# Patient Record
Sex: Female | Born: 1960 | Race: White | Hispanic: No | Marital: Married | Smoking: Current every day smoker
Health system: Southern US, Community
[De-identification: ages and names within clinical notes are randomized; demographics above are authoritative.]

## PROBLEM LIST (undated history)

## (undated) DIAGNOSIS — J45909 Unspecified asthma, uncomplicated: Secondary | ICD-10-CM

---

## 2004-10-06 ENCOUNTER — Ambulatory Visit: Payer: Self-pay | Admitting: Internal Medicine

## 2005-05-07 ENCOUNTER — Ambulatory Visit: Payer: Self-pay

## 2006-02-08 ENCOUNTER — Emergency Department: Payer: Self-pay | Admitting: Emergency Medicine

## 2006-02-09 ENCOUNTER — Ambulatory Visit: Payer: Self-pay | Admitting: Emergency Medicine

## 2011-11-24 ENCOUNTER — Emergency Department: Payer: Self-pay | Admitting: Emergency Medicine

## 2011-11-24 LAB — CBC
HGB: 14.4 g/dL (ref 12.0–16.0)
Platelet: 256 10*3/uL (ref 150–440)
RDW: 13.5 % (ref 11.5–14.5)
WBC: 13.7 10*3/uL — ABNORMAL HIGH (ref 3.6–11.0)

## 2011-11-24 LAB — COMPREHENSIVE METABOLIC PANEL
Anion Gap: 10 (ref 7–16)
BUN: 15 mg/dL (ref 7–18)
Bilirubin,Total: 0.2 mg/dL (ref 0.2–1.0)
Chloride: 102 mmol/L (ref 98–107)
Co2: 31 mmol/L (ref 21–32)
Creatinine: 0.9 mg/dL (ref 0.60–1.30)
EGFR (African American): >60
EGFR (Non-African Amer.): >60
Potassium: 4.3 mmol/L (ref 3.5–5.1)
Sodium: 143 mmol/L (ref 136–145)

## 2011-11-24 LAB — CK TOTAL AND CKMB (NOT AT ARMC)
CK, Total: 95 U/L (ref 21–215)
CK-MB: 2.5 ng/mL (ref 0.5–3.6)

## 2012-02-01 ENCOUNTER — Emergency Department: Payer: Self-pay | Admitting: Emergency Medicine

## 2012-02-10 ENCOUNTER — Ambulatory Visit: Payer: Self-pay | Admitting: Family Medicine

## 2012-05-04 ENCOUNTER — Ambulatory Visit: Payer: Self-pay | Admitting: Family Medicine

## 2012-08-23 ENCOUNTER — Ambulatory Visit: Payer: Self-pay | Admitting: Family Medicine

## 2013-02-28 ENCOUNTER — Ambulatory Visit: Payer: Self-pay | Admitting: Family Medicine

## 2013-03-23 ENCOUNTER — Ambulatory Visit: Payer: Self-pay | Admitting: Cardiothoracic Surgery

## 2013-04-21 ENCOUNTER — Ambulatory Visit: Payer: Self-pay | Admitting: Cardiothoracic Surgery

## 2013-05-02 ENCOUNTER — Ambulatory Visit: Payer: Self-pay | Admitting: Specialist

## 2013-05-10 ENCOUNTER — Ambulatory Visit: Payer: Self-pay | Admitting: Family Medicine

## 2013-08-28 ENCOUNTER — Ambulatory Visit: Payer: Self-pay | Admitting: Specialist

## 2013-08-29 ENCOUNTER — Ambulatory Visit: Payer: Self-pay | Admitting: Radiation Oncology

## 2013-09-21 ENCOUNTER — Ambulatory Visit: Payer: Self-pay | Admitting: Radiation Oncology

## 2014-01-22 ENCOUNTER — Ambulatory Visit: Payer: Self-pay | Admitting: Nurse Practitioner

## 2014-07-27 ENCOUNTER — Ambulatory Visit: Payer: Self-pay | Admitting: Neurology

## 2014-08-14 ENCOUNTER — Ambulatory Visit: Payer: Self-pay | Admitting: Nurse Practitioner

## 2014-09-11 ENCOUNTER — Ambulatory Visit: Payer: Self-pay | Admitting: Specialist

## 2015-01-11 NOTE — Consult Note (Signed)
Reason for Visit: This 54 year old Female patient presents to the clinic for initial evaluation of  left upper lobe lung mass .   Referred by Dr. Inez Catalina.  Diagnosis:  Chief Complaint/Diagnosis   54 year old female with unbiopsied 7 mm left upper lobe nodule.  Imaging Report serial CT scans reviewed   Referral Report clinical notes reviewed   Planned Treatment Regimen observation   HPI   patient is a 54 year old female whose history dates back approximately a year when she presented with flulike symptoms and possible rib fracture CT scan of the chest revealed a left upper lobe mass approximately 6 mm in greatest dimension. This was followed with a CT scan about 6 months later showing a possible 1 mm growth in the lesion. She has significant COPD with aFEV1 of 37 is percent of predicted. Seen by surgical oncology and not an operative candidate based on her pulmonary functions. She has long-standing history of smoking abuse. She specifically denies cough hemoptysis or chest tightness.  Past, Family and Social History:  Past Medical History positive   Respiratory COPD   Genitourinary kidney stones   Past Surgical History cholecystectomy   Family History positive   Family History Comments husband with lung cancer   Social History positive   Social History Comments greater than 40-40 year pack smoking history no EtOH abuse history   Additional Past Medical and Surgical History accompanied by multiple family members today   Allergies:   No Known Allergies:   Home Meds:  Home Medications: Medication Instructions Status  Ventolin HFA 90 mcg/inh inhalation aerosol 2 puff(s) inhaled 4 times a day Active  Breo Ellipta 100 mcg-25 mcg inhalation powder 1 puff(s) inhaled once a day Active  Vitamin D3 5000 intl units oral tablet 1 tab(s) orally once a day Active  Spiriva 18 mcg inhalation capsule 1 each inhaled once a day Active   Review of Systems:  General negative    Performance Status (ECOG) 0   Skin negative   Breast negative   Ophthalmologic negative   ENMT negative   Respiratory and Thorax see HPI   Cardiovascular negative   Gastrointestinal negative   Genitourinary negative   Musculoskeletal negative   Neurological negative   Psychiatric negative   Hematology/Lymphatics negative   Endocrine negative   Allergic/Immunologic negative   Review of Systems   according to the nurse's notesPatient denies any weight loss, fatigue, weakness, fever, chills or night sweats. Patient denies any loss of vision, blurred vision. Patient denies any ringing  of the ears or hearing loss. No irregular heartbeat. Patient denies heart murmur or history of fainting. Patient denies any chest pain or pain radiating to her upper extremities. Patient denies any shortness of breath, difficulty breathing at night, cough or hemoptysis. Patient denies any swelling in the lower legs. Patient denies any nausea vomiting, vomiting of blood, or coffee ground material in the vomitus. Patient denies any stomach pain. Patient states has had normal bowel movements no significant constipation or diarrhea. Patient denies any dysuria, hematuria or significant nocturia. Patient denies any problems walking, swelling in the joints or loss of balance. Patient denies any skin changes, loss of hair or loss of weight. Patient denies any excessive worrying or anxiety or significant depression. Patient denies any problems with insomnia. Patient denies excessive thirst, polyuria, polydipsia. Patient denies any swollen glands, patient denies easy bruising or easy bleeding. Patient denies any recent infections, allergies or URI. Patient "s visual fields have not changed significantly in recent  time.  Nursing Notes:  Nursing Vital Signs and Chemo Nursing Nursing Notes: *CC Vital Signs Flowsheet:   07-Jul-14 13:13  Temp Temperature 97.1  Pulse Pulse 98  Respirations Respirations 22  SBP SBP  150  DBP DBP 87  Pain Scale (0-10)  0  Current Weight (kg) (kg) 99.8  Height (cm) centimeters 157.4  BSA (m2) 1.9   Physical Exam:  General/Skin/HEENT:  Skin normal   Eyes normal   ENMT normal   Head and Neck normal   Additional PE well-developed obese female in NAD. Lungs are clear to A&P cardiac examination shows regular rate and rhythm. No cervical supraclavicular or axillary adenopathy is appreciated abdomen is obese with no organomegaly or masses noted.   Breasts/Resp/CV/GI/GU:  Respiratory and Thorax normal   Cardiovascular normal   Gastrointestinal normal   Genitourinary normal   MS/Neuro/Psych/Lymph:  Musculoskeletal normal   Neurological normal   Lymphatics normal   Other Results:  Radiology Results: LabUnknown:    10-Jun-14 11:08, CT Chest Without Contrast  PACS Image   CT:    05-Mar-13 17:32, CT Chest for Pulm Embolism With Contrast  CT Chest for Pulm Embolism With Contrast   REASON FOR EXAM:    chest pain, tachycardia, hypoxia, + d-dimer  COMMENTS:       PROCEDURE: CT  - CT CHEST (FOR PE) W  - Nov 24 2011  5:32PM     RESULT: Chest CT dated 11/24/2011.    Technique: Helical 3 mm sections were obtained the thoracic inlet the  lung bases status post intravenous administration of 75 mL of Isovue-370.    Findings: Evaluation mediastinum and hilar regions and structures   demonstrates no evidence of mediastinal or hilar adenopathy nor masses.   There is no evidence of filling defects within the main, lobar, or   segmental pulmonary arteries. A very small wedge-shaped area of increased   density projects along the anterior cardio phrenic angle region may     reflect atelectasis or possibly scarring. The lung parenchyma otherwise   demonstrates an ill-defined small area of nodular density along the   posterior periphery of the lateral segment of the right middle lobe.   Measuring approximately 9 mm in longitudinal dimensions. Similar   different  considerations. Due tonodular component surveillance of this   finding is recommended.    Visualized upper abdominal viscera demonstrate fullness in the region of   the uncinate process of the pancreas. This may simple represent volume   averaging from the from surrounding soft tissues pole mass cannot be   excluded. Followup dedicated CT of the abdomen it is recommended.    IMPRESSION:   1. No CT evidence of pulmonary arterial embolic disease.  2. Areas of increased density within the right middle lobe as well as the    anterior cardiophrenic angle region. Surveillance evaluation is   recommended.  3. Finding which may reps in a mass within the uncinate process of the   pancreas followup dedicated CT of the abdomen is recommended.          Verified By: Jani Files, M.D., MD    10-Jun-14 11:08, CT Chest Without Contrast  CT Chest Without Contrast   REASON FOR EXAM:    Follow up lung nodule  COMMENTS:       PROCEDURE: CT  - CT CHEST WITHOUT CONTRAST  - Feb 28 2013 11:08AM     RESULT: History: Pulmonary nodule.    Comparison Study: Prior CT of 08/23/2012.  Findings: Standard nonenhanced CT obtained. Lung CAD obtained. Previously   identified pulmonary nodule in the left upper lobe (lung windows image   #23 )measured 5x 3 mm old prior study of 08/23/2012. On today's   examination it appears to measure approximately 6 x 3 mm and appears   slightly more dense ( lung windows image #22). Tiny lung cancer cannot be   excluded. Slight interstitial process nodularity noted elsewhere     including the lingula is unchanged most likely related to interstitial   scarring. Shotty mediastinal lymph nodes are noted. Thoracic were aorta   caliber normal. Adrenals normal. Right nephrolithiasis. Cholecystectomy.   No acute or focal bony abnormality.    IMPRESSION:  Cannot exclude very subtle interval increase in left upper   lobe pulmonary nodule size.Tiny lung carcinoma cannot be  excluded.   Consultation with oncology suggested.        Verified By: Gwynn BurlyHOMAS E. REGISTER, M.D., MD   Relevent Results:   Relevant Scans and Labs serial CT scans are reviewed.   Assessment and Plan: Impression:   questionable left upper lobe small extremely slow growing mass questionable for malignancy Plan:   at this time I believe this lesion is so small and its growth pattern over 6 months or so incrementally small I believe we can continue to observe this at this time. Esophagus which showed a PET/CT scan. I have asked to see her back in 4 months and we'll repeat CT scan at that time. Should there be significant growth of the lesion over time would recommend her for biopsy either with GPS navigation or CT-guided fine-needle. All this was explained to the patient and her family and they're comfortable with my decision to continue to observe. I discussed the case personally with Dr. Doylene Canninghoksi who agrees with the treatment plan.  I would like to take this opportunity to thank you for allowing me to continue to participate in this patient's care.  CC Referral:  cc: Dr. Beverely RisenFozia Khan   Electronic Signatures: Rushie Chestnuthrystal, Gordy CouncilmanGlenn S (MD)  (Signed 07-Jul-14 15:20)  Authored: HPI, Diagnosis, PFSH, Allergies, Home Meds, ROS, Nursing Notes, Physical Exam, Other Results, Relevent Results, Encounter Assessment and Plan, CC Referring Physician   Last Updated: 07-Jul-14 15:20 by Rebeca Alerthrystal, Roey Coopman S (MD)

## 2015-02-02 ENCOUNTER — Emergency Department
Admission: EM | Admit: 2015-02-02 | Discharge: 2015-02-03 | Disposition: A | Discharge status: Home / Self Care | Payer: BLUE CROSS/BLUE SHIELD | Attending: Student | Admitting: Student

## 2015-02-02 ENCOUNTER — Emergency Department: Payer: BLUE CROSS/BLUE SHIELD

## 2015-02-02 ENCOUNTER — Other Ambulatory Visit: Payer: Self-pay

## 2015-02-02 DIAGNOSIS — J441 Chronic obstructive pulmonary disease with (acute) exacerbation: Secondary | ICD-10-CM | POA: Diagnosis not present

## 2015-02-02 DIAGNOSIS — R531 Weakness: Secondary | ICD-10-CM | POA: Diagnosis present

## 2015-02-02 DIAGNOSIS — M79622 Pain in left upper arm: Secondary | ICD-10-CM | POA: Insufficient documentation

## 2015-02-02 DIAGNOSIS — R52 Pain, unspecified: Secondary | ICD-10-CM

## 2015-02-02 HISTORY — DX: Unspecified asthma, uncomplicated: J45.909

## 2015-02-02 LAB — COMPREHENSIVE METABOLIC PANEL
ALBUMIN: 4.2 g/dL (ref 3.5–5.0)
ALT: 38 U/L (ref 14–54)
AST: 32 U/L (ref 15–41)
Alkaline Phosphatase: 83 U/L (ref 38–126)
Anion gap: 7 (ref 5–15)
BUN: 14 mg/dL (ref 6–20)
CO2: 33 mmol/L — AB (ref 22–32)
Calcium: 9.1 mg/dL (ref 8.9–10.3)
Chloride: 101 mmol/L (ref 101–111)
Creatinine, Ser: 0.64 mg/dL (ref 0.44–1.00)
GFR calc Af Amer: >60 mL/min (ref >60)
Glucose, Bld: 94 mg/dL (ref 65–99)
POTASSIUM: 4.8 mmol/L (ref 3.5–5.1)
Sodium: 141 mmol/L (ref 135–145)
TOTAL PROTEIN: 8.6 g/dL — AB (ref 6.5–8.1)
Total Bilirubin: 0.4 mg/dL (ref 0.3–1.2)

## 2015-02-02 LAB — URINALYSIS COMPLETE WITH MICROSCOPIC (ARMC ONLY)
BILIRUBIN URINE: NEGATIVE
Glucose, UA: NEGATIVE mg/dL
Hgb urine dipstick: NEGATIVE
Ketones, ur: NEGATIVE mg/dL
LEUKOCYTES UA: NEGATIVE
Nitrite: NEGATIVE
Protein, ur: NEGATIVE mg/dL
Specific Gravity, Urine: 1.015 (ref 1.005–1.030)
pH: 8 (ref 5.0–8.0)

## 2015-02-02 LAB — CBC WITH DIFFERENTIAL/PLATELET
BASOS PCT: 1 %
Basophils Absolute: 0.1 10*3/uL (ref 0–0.1)
EOS ABS: 0.3 10*3/uL (ref 0–0.7)
Eosinophils Relative: 3 %
HCT: 42.7 % (ref 35.0–47.0)
HEMOGLOBIN: 14.1 g/dL (ref 12.0–16.0)
LYMPHS ABS: 3.1 10*3/uL (ref 1.0–3.6)
Lymphocytes Relative: 30 %
MCH: 30 pg (ref 26.0–34.0)
MCHC: 33.1 g/dL (ref 32.0–36.0)
MCV: 90.6 fL (ref 80.0–100.0)
MONO ABS: 0.7 10*3/uL (ref 0.2–0.9)
Monocytes Relative: 7 %
NEUTROS PCT: 59 %
Neutro Abs: 6.2 10*3/uL (ref 1.4–6.5)
PLATELETS: 221 10*3/uL (ref 150–440)
RBC: 4.72 MIL/uL (ref 3.80–5.20)
RDW: 13.3 % (ref 11.5–14.5)
WBC: 10.4 10*3/uL (ref 3.6–11.0)

## 2015-02-02 LAB — TROPONIN I: Troponin I: <0.03 ng/mL (ref <0.031)

## 2015-02-02 MED ORDER — OXYCODONE-ACETAMINOPHEN 5-325 MG PO TABS
2.0000 | ORAL_TABLET | Freq: Once | ORAL | Status: AC
  Administered 2015-02-03: 2 via ORAL

## 2015-02-02 NOTE — ED Notes (Signed)
Pt presents to ER alert and in NAD. Pt reports she has stage 4 COPD. pt states she is supposed to be on O2, but doesn't wear it. Pt reports she is feeling weak today.

## 2015-02-02 NOTE — ED Notes (Signed)
Patient oxygen dependency prescribed 2L but recently has been increasing to 3L.  Reports difficulty walking even with walker due to generalized pains.  Reports having "bone pain" Left arm pain is the worst pain, but no swelling noted.  States with oxygen at 3L her headaches haven't been bad either.

## 2015-02-02 NOTE — ED Provider Notes (Signed)
[facility] Emergency Department Provider Note  ____________________________________________  Time seen: Approximately 11:09 PM  I have reviewed the triage vital signs and the nursing notes.   HISTORY  Chief Complaint Weakness    HPI Sierra Galloway is a 54 y.o. female stage IV COPD on chronic home oxygen requirement of 2 L, ovarian cyst versus cancer, obesity presents for evaluation one month of worsening dyspnea. She reports her dyspnea is also worse when she is exposed to cooking fumes at home. She has been using her albuterol nebulizer treatments which temporarily improve her symptoms. No chest pain. She has had cough but no fever. She has felt generally weak, worse for the past month. She is also complaining of constant left upper arm pain which has been atraumatic. Current severity of her arm pain is 4 out of 10, described as aching, movement makes it worse, no associated swelling.   Past Medical History  Diagnosis Date  . Asthma     There are no active problems to display for this patient.   No past surgical history on file.  No current outpatient prescriptions on file.  Allergies Review of patient's allergies indicates no known allergies.  No family history on file.  Social History History  Substance Use Topics  . Smoking status: Not on file  . Smokeless tobacco: Not on file  . Alcohol Use: Not on file    Review of Systems Constitutional: No fever/chills Eyes: No visual changes. ENT: No sore throat. Cardiovascular: Denies chest pain. Respiratory: +shortness of breath. Gastrointestinal: No abdominal pain.  No nausea, no vomiting.  No diarrhea.  No constipation. Genitourinary: Negative for dysuria. Musculoskeletal: Negative for back pain. Skin: Negative for rash. Neurological: Negative for headaches, focal weakness or numbness.  10-point ROS otherwise negative.  ____________________________________________   PHYSICAL  EXAM:  VITAL SIGNS: ED Triage Vitals  Enc Vitals Group     BP 02/02/15 2130 139/70 mmHg     Pulse Rate 02/02/15 2130 88     Resp 02/02/15 2130 20     Temp 02/02/15 2130 98.3 F (36.8 C)     Temp Source 02/02/15 2130 Oral     SpO2 02/02/15 2130 94 %     Weight 02/02/15 2130 219 lb (99.338 kg)     Height 02/02/15 2130 5\' 2"  (1.575 m)     Head Cir --      Peak Flow --      Pain Score 02/02/15 2131 7     Pain Loc --      Pain Edu? --      Excl. in GC? --     Constitutional: Alert and oriented. Well appearing and in no acute distress. Eyes: Conjunctivae are normal. PERRL. EOMI. Head: Atraumatic. Nose: No congestion/rhinnorhea. Mouth/Throat: Mucous membranes are moist.  Oropharynx non-erythematous. Neck: No stridor.   Cardiovascular: Normal rate, regular rhythm. Grossly normal heart sounds.  Good peripheral circulation. Respiratory: Normal respiratory effort.  No retractions. Lungs CTAB. Gastrointestinal: Soft and nontender. No distention. No abdominal bruits. No CVA tenderness. Genitourinary: deferred Musculoskeletal: No lower extremity tenderness nor edema.  No joint effusions. Mild left upper arm tenderness without swelling Neurologic:  Normal speech and language. No gross focal neurologic deficits are appreciated. Speech is normal. No gait instability. 5 out of 5 strength in bilateral upper and lower extremities, sensation intact to light touch throughout Skin:  Skin is warm, dry and intact. No rash noted. Psychiatric: Mood and affect are normal. Speech and behavior are normal.  ____________________________________________   LABS (all labs ordered are listed, but only abnormal results are displayed)  Labs Reviewed  COMPREHENSIVE METABOLIC PANEL - Abnormal; Notable for the following:    CO2 33 (*)    Total Protein 8.6 (*)    All other components within normal limits  URINALYSIS COMPLETEWITH MICROSCOPIC ([facility])  - Abnormal; Notable for the following:    Color, Urine  STRAW (*)    APPearance CLEAR (*)    Bacteria, UA RARE (*)    Squamous Epithelial / LPF 0-5 (*)    All other components within normal limits  CBC WITH DIFFERENTIAL/PLATELET  TROPONIN I  TROPONIN I   ____________________________________________  EKG  ED ECG REPORT   Date: 02/03/2015  EKG Time: 21:56  Rate: 90  Rhythm:  normal sinus rhythm  Axis: Normal axis  Intervals:none  ST&T Change: No acute ST segment change, Q waves V1 through V3  ____________________________________________  RADIOLOGY  Doppler US of Left arm: IMPRESSION: No evidence of deep venous thrombosis.  CT head: IMPRESSION: No acute intracranial abnormalities.  CTA chest: FINDINGS: No evidence of significant pulmonary embolus. Diffuse emphysematous changes in the lungs with patchy tree-in-bud infiltrates suggesting bronchiolitis.  Review of the MIP images confirms the above findings.  CXR: IMPRESSION: Emphysematous changes in the lungs. No evidence of active pulmonary disease. ____________________________________________   PROCEDURES  Procedure(s) performed: None  Critical Care performed: No  ____________________________________________   INITIAL IMPRESSION / ASSESSMENT AND PLAN / ED COURSE  Pertinent labs & imaging results that were available during my care of the patient were reviewed by me and considered in my medical decision making (see chart for details).  Sierra Galloway is a 54 y.o. female stage IV COPD on chronic home oxygen requirement of 2 L, ovarian cyst versus "cancer", obesity presents for evaluation one month of worsening dyspnea. On exam, she is very well-appearing and in no acute distress. No increase in her chronic home oxygen requirement.Benign neurological exam. No wheeze, no increased work of breathing. Likely worsening/progressing COPD but given history of cancer diagnosis last year, we'll obtain CT head and CTA chest as well as ultrasound of the left upper extremity  to rule out DVT. We'll obtain secondary troponin although doubt ACS.  ----------------------------------------- 4:53 AM on 02/03/2015 -----------------------------------------  Patient continues to appear well. O2 sat 95% on her chronic home oxygen requirement. Blood pressure 103/60. She continues to appear well. Imaging is negative for any acute life-threatening process. 2 troponins negative and I doubt ACS. We'll discharge with prednisone, and Levaquin for possible COPD flare. She will follow-up with her doctor in 48 hours. She is comfortable with discharge home. Return precautions given.   ____________________________________________   FINAL CLINICAL IMPRESSION(S) / ED DIAGNOSES  Final diagnoses:  Pain  COPD exacerbation      Gayla DossEryka A Rushie Brazel, MD 02/03/15 0710

## 2015-02-03 ENCOUNTER — Emergency Department: Payer: BLUE CROSS/BLUE SHIELD

## 2015-02-03 LAB — TROPONIN I

## 2015-02-03 MED ORDER — PREDNISONE 20 MG PO TABS
60.0000 mg | ORAL_TABLET | Freq: Once | ORAL | Status: AC
  Administered 2015-02-03: 60 mg via ORAL

## 2015-02-03 MED ORDER — PREDNISONE 10 MG PO TABS: 60.0000 mg | ORAL_TABLET | Freq: Every day | ORAL | Status: AC

## 2015-02-03 MED ORDER — LEVOFLOXACIN 750 MG PO TABS
750.0000 mg | ORAL_TABLET | Freq: Once | ORAL | Status: AC
  Administered 2015-02-03: 750 mg via ORAL

## 2015-02-03 MED ORDER — PREDNISONE 20 MG PO TABS
ORAL_TABLET | ORAL | Status: AC
  Administered 2015-02-03: 60 mg via ORAL
  Filled 2015-02-03: qty 3

## 2015-02-03 MED ORDER — IOHEXOL 350 MG/ML SOLN
100.0000 mL | Freq: Once | INTRAVENOUS | Status: AC | PRN
  Administered 2015-02-03: 100 mL via INTRAVENOUS

## 2015-02-03 MED ORDER — LEVOFLOXACIN 500 MG PO TABS
ORAL_TABLET | ORAL | Status: AC
  Administered 2015-02-03: 750 mg via ORAL
  Filled 2015-02-03: qty 1

## 2015-02-03 MED ORDER — OXYCODONE-ACETAMINOPHEN 5-325 MG PO TABS
ORAL_TABLET | ORAL | Status: AC
  Administered 2015-02-03: 2 via ORAL
  Filled 2015-02-03: qty 2

## 2015-02-03 MED ORDER — LEVOFLOXACIN 750 MG PO TABS: 750.0000 mg | ORAL_TABLET | Freq: Every day | ORAL | Status: AC

## 2015-02-03 MED ORDER — LEVOFLOXACIN 250 MG PO TABS
ORAL_TABLET | ORAL | Status: DC
Start: 2015-02-03 — End: 2015-02-03
  Filled 2015-02-03: qty 1

## 2015-02-03 NOTE — ED Notes (Signed)
Patient transported to Ultrasound 

## 2015-04-16 ENCOUNTER — Ambulatory Visit: Payer: BLUE CROSS/BLUE SHIELD

## 2015-05-28 ENCOUNTER — Ambulatory Visit: Payer: BLUE CROSS/BLUE SHIELD

## 2015-10-11 IMAGING — CT CT ANGIO CHEST
1 of 2 series · 18 of 30 positions shown · IV contrast (APPLIED)
Comparison: 09/11/2014 technically adequate study with good
opacification of the central and segmental pulmonary arteries.

CLINICAL DATA: Shortness of breath and dyspnea today. Difficulty
walking due to pain. Left arm pain. Patient is supposed to be on
oxygen but does not wear it.

EXAM:
CT ANGIOGRAPHY CHEST WITH CONTRAST
TECHNIQUE: Multidetector CT imaging of the chest was performed using the
standard protocol during bolus administration of intravenous
contrast. Multiplanar CT image reconstructions and MIPs were
obtained to evaluate the vascular anatomy.
CONTRAST:  100mL OMNIPAQUE IOHEXOL 350 MG/ML SOLN

[Series 5: pe 1.0 thins · axial · 0.67mm/px · z∈[-338,-66]mm · 18 of 306 slices shown]
[im 17/306  lung]
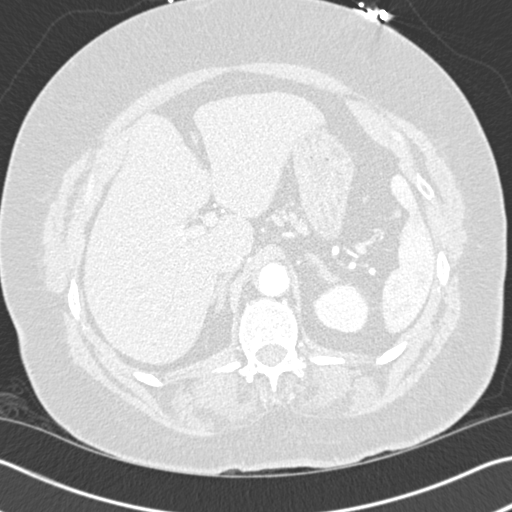
[im 34/306  mediastinal]
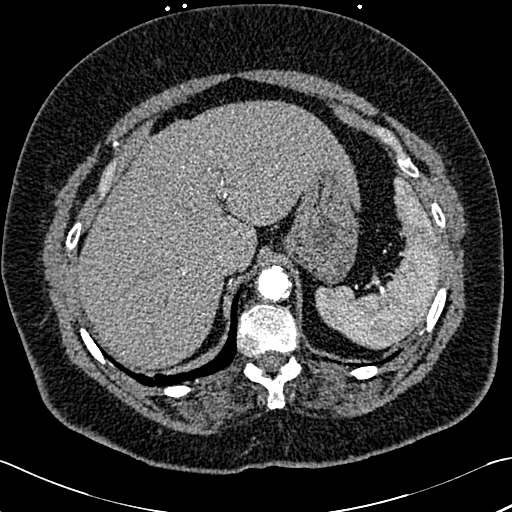
[im 51/306  lung]
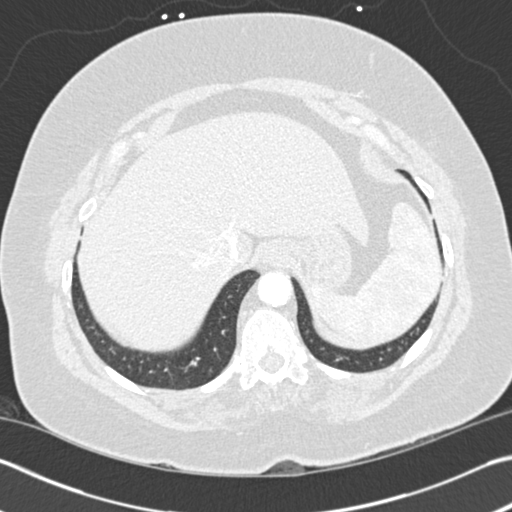
[im 68/306  mediastinal]
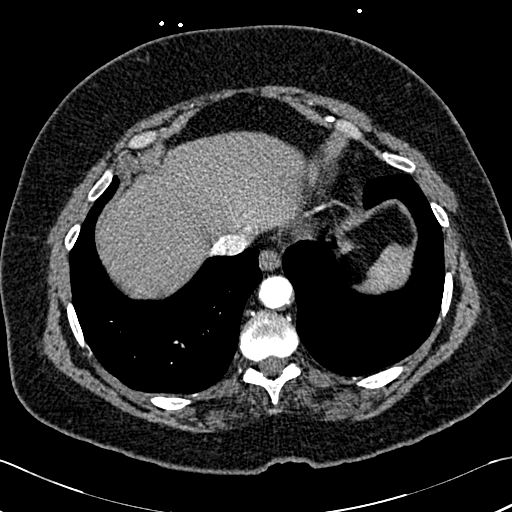
[im 85/306  lung]
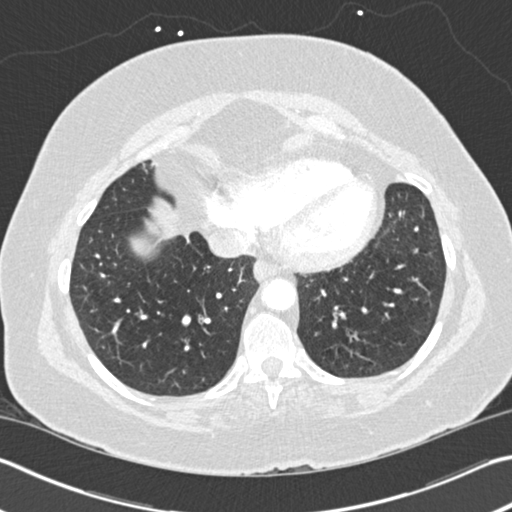
[im 102/306  mediastinal]
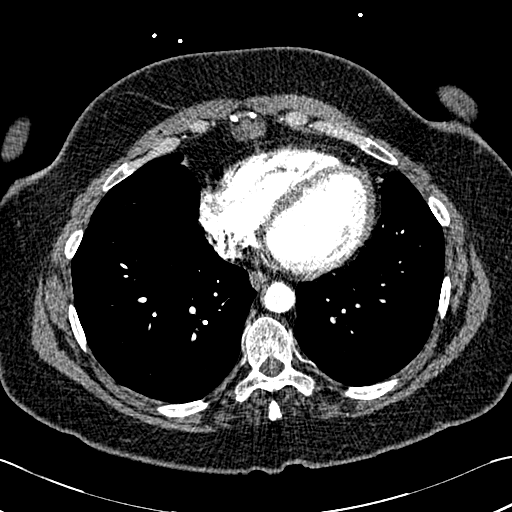
[im 119/306  lung]
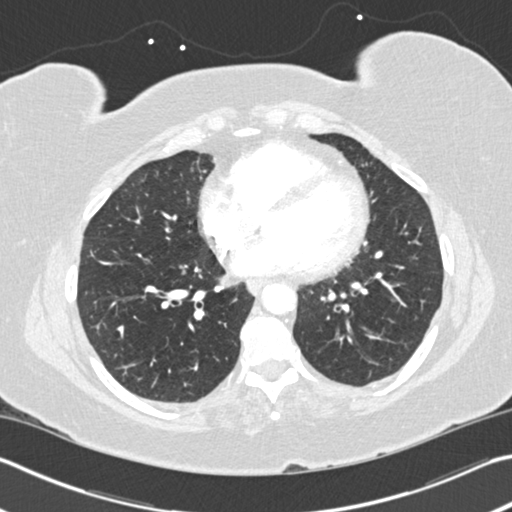
[im 136/306  mediastinal]
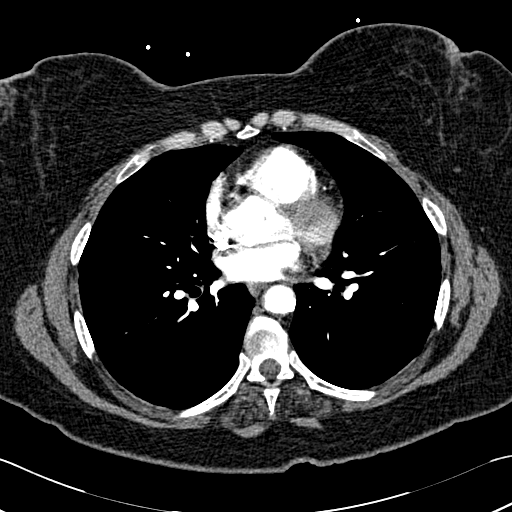
[im 144/306  lung]
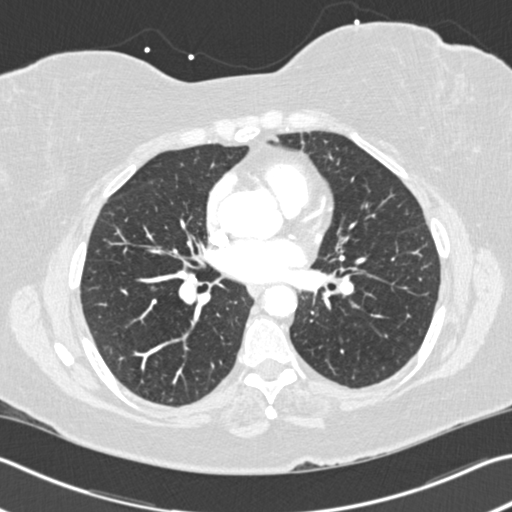
[im 153/306  mediastinal]
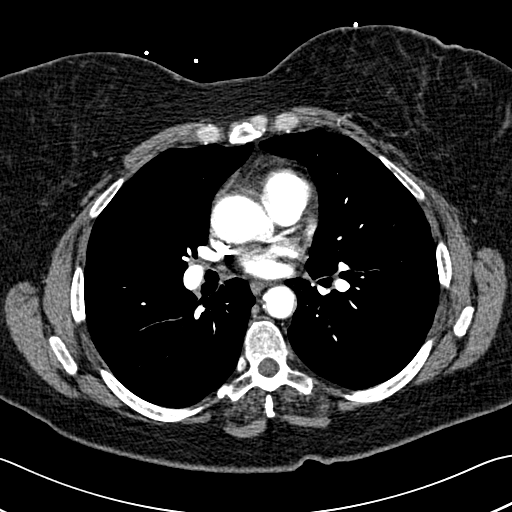
[im 170/306  lung]
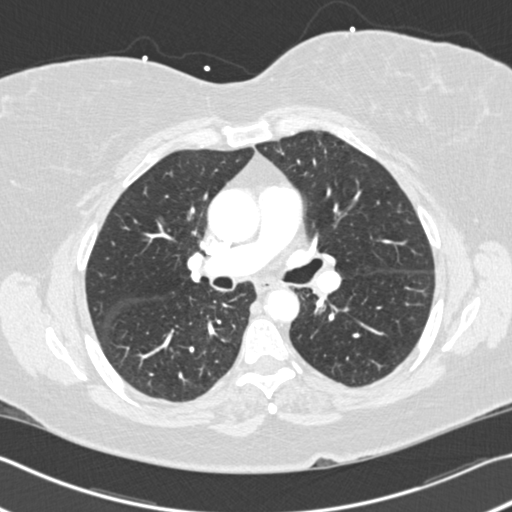
[im 187/306  mediastinal]
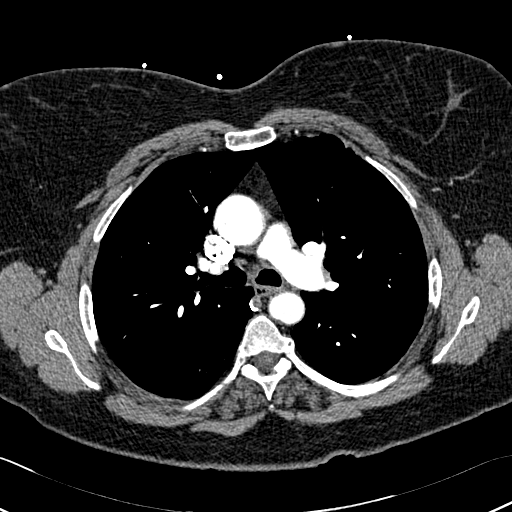
[im 204/306  lung]
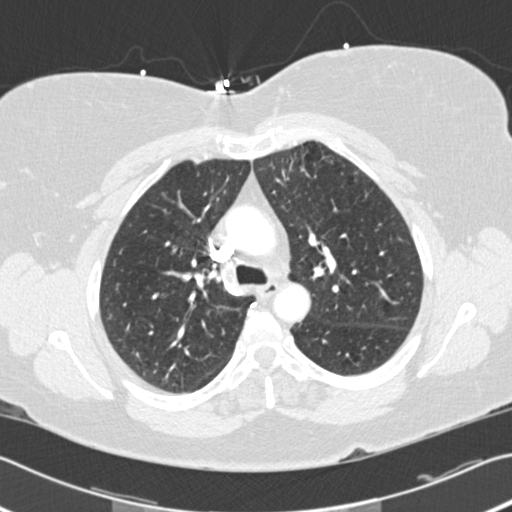
[im 221/306  mediastinal]
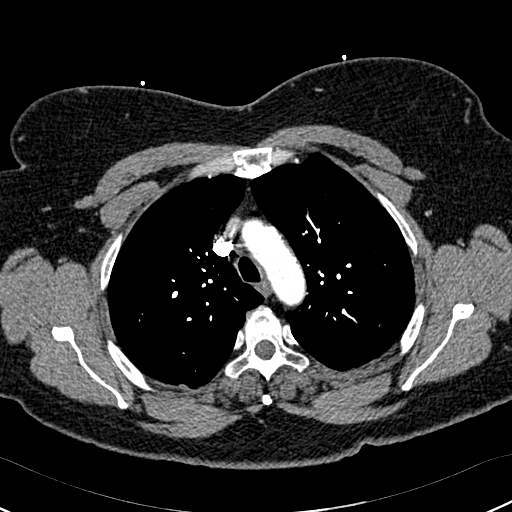
[im 238/306  lung]
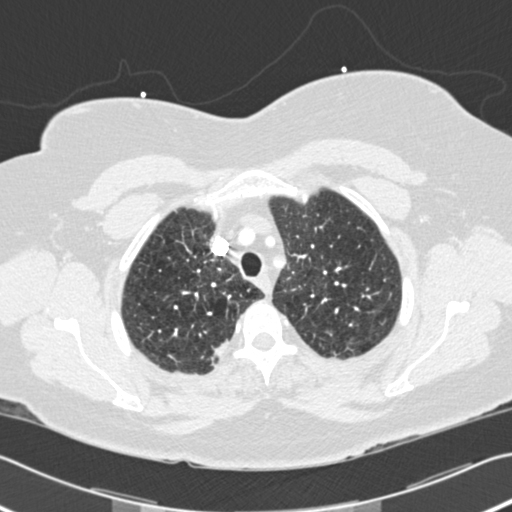
[im 255/306  mediastinal]
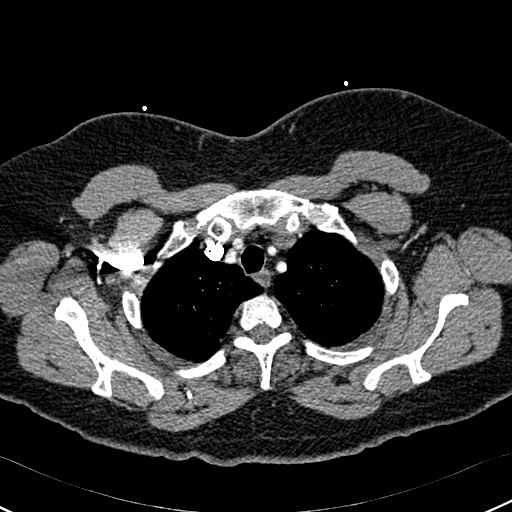
[im 272/306  lung]
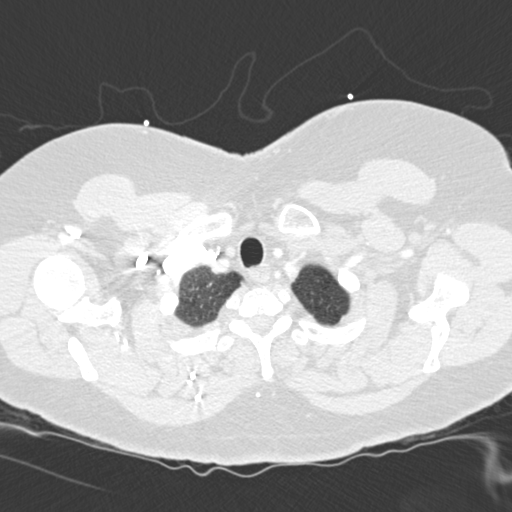
[im 289/306  mediastinal]
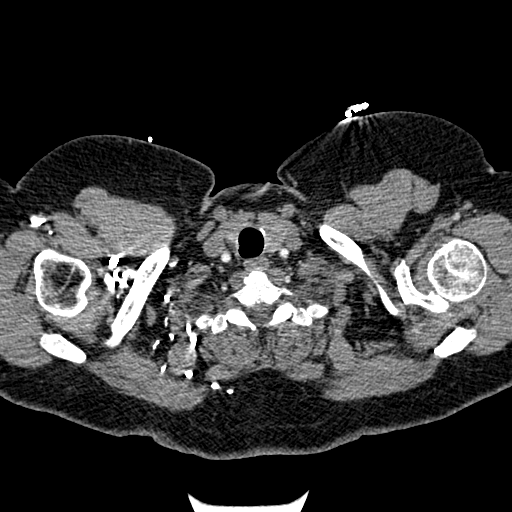

[18 of 30 positions shown; findings below may reference images not displayed]

No
focal filling defects demonstrated. No evidence of significant
pulmonary embolus.

Normal heart size. Normal caliber thoracic aorta. Aortic
calcification. Great vessel origins are patent. There is evidence of
moderate stenosis of the left subclavian artery origin. Esophagus is
decompressed. No significant lymphadenopathy in the chest.

Diffuse emphysematous changes throughout the lungs. Patchy
tree-in-bud infiltrates demonstrated in the lungs suggesting
bronchiolitis. Airways thickening. Focal scarring or atelectasis in
the right upper lung. Unchanged appearance of left upper lobe
pulmonary nodule. No pleural effusions. No pneumothorax.

Included portions of the upper abdominal organs are grossly
unremarkable.
FINDINGS: No evidence of significant pulmonary embolus. Diffuse emphysematous
changes in the lungs with patchy tree-in-bud infiltrates suggesting
bronchiolitis.

Review of the MIP images confirms the above findings.

## 2015-11-14 ENCOUNTER — Emergency Department
Admission: EM | Admit: 2015-11-14 | Discharge: 2015-11-14 | Disposition: A | Discharge status: Home / Self Care | Payer: BLUE CROSS/BLUE SHIELD | Attending: Emergency Medicine | Admitting: Emergency Medicine

## 2015-11-14 ENCOUNTER — Emergency Department: Payer: BLUE CROSS/BLUE SHIELD

## 2015-11-14 DIAGNOSIS — F17200 Nicotine dependence, unspecified, uncomplicated: Secondary | ICD-10-CM | POA: Diagnosis not present

## 2015-11-14 DIAGNOSIS — J439 Emphysema, unspecified: Secondary | ICD-10-CM | POA: Diagnosis not present

## 2015-11-14 DIAGNOSIS — R2243 Localized swelling, mass and lump, lower limb, bilateral: Secondary | ICD-10-CM | POA: Diagnosis not present

## 2015-11-14 DIAGNOSIS — Z9981 Dependence on supplemental oxygen: Secondary | ICD-10-CM | POA: Insufficient documentation

## 2015-11-14 DIAGNOSIS — Z7952 Long term (current) use of systemic steroids: Secondary | ICD-10-CM | POA: Insufficient documentation

## 2015-11-14 DIAGNOSIS — R0602 Shortness of breath: Secondary | ICD-10-CM | POA: Diagnosis present

## 2015-11-14 LAB — CBC
HCT: 41.8 % (ref 35.0–47.0)
Hemoglobin: 13.6 g/dL (ref 12.0–16.0)
MCH: 29.2 pg (ref 26.0–34.0)
MCHC: 32.6 g/dL (ref 32.0–36.0)
MCV: 89.8 fL (ref 80.0–100.0)
PLATELETS: 254 10*3/uL (ref 150–440)
RBC: 4.65 MIL/uL (ref 3.80–5.20)
RDW: 13.3 % (ref 11.5–14.5)
WBC: 11.9 10*3/uL — ABNORMAL HIGH (ref 3.6–11.0)

## 2015-11-14 LAB — BASIC METABOLIC PANEL
Anion gap: 10 (ref 5–15)
BUN: 15 mg/dL (ref 6–20)
CO2: 34 mmol/L — ABNORMAL HIGH (ref 22–32)
CREATININE: 0.65 mg/dL (ref 0.44–1.00)
Calcium: 9.1 mg/dL (ref 8.9–10.3)
Chloride: 98 mmol/L — ABNORMAL LOW (ref 101–111)
GFR calc Af Amer: >60 mL/min (ref >60)
GFR calc non Af Amer: >60 mL/min (ref >60)
Glucose, Bld: 114 mg/dL — ABNORMAL HIGH (ref 65–99)
Potassium: 4.3 mmol/L (ref 3.5–5.1)
Sodium: 142 mmol/L (ref 135–145)

## 2015-11-14 LAB — TROPONIN I: Troponin I: <0.03 ng/mL (ref <0.031)

## 2015-11-14 LAB — BRAIN NATRIURETIC PEPTIDE: B Natriuretic Peptide: 16 pg/mL (ref 0.0–100.0)

## 2015-11-14 MED ORDER — IPRATROPIUM-ALBUTEROL 0.5-2.5 (3) MG/3ML IN SOLN
3.0000 mL | Freq: Once | RESPIRATORY_TRACT | Status: AC
  Administered 2015-11-14: 3 mL via RESPIRATORY_TRACT
  Filled 2015-11-14: qty 3

## 2015-11-14 NOTE — ED Notes (Signed)
Patient to ED via POV for shortness of breath, chest tightness, COPD and bilateral feet swelling.

## 2015-11-14 NOTE — Discharge Instructions (Signed)
Chronic Obstructive Pulmonary Disease °Chronic obstructive pulmonary disease (COPD) is a common lung problem. In COPD, the flow of air from the lungs is limited. The way your lungs work will probably never return to normal, but there are things you can do to improve your lungs and make yourself feel better. Your doctor may treat your condition with: °· Medicines. °· Oxygen. °· Lung surgery. °· Changes to your diet. °· Rehabilitation. This may involve a team of specialists. °HOME CARE °· Take all medicines as told by your doctor. °· Avoid medicines or cough syrups that dry up your airway (such as antihistamines) and do not allow you to get rid of thick spit. You do not need to avoid them if told differently by your doctor. °· If you smoke, stop. Smoking makes the problem worse. °· Avoid being around things that make your breathing worse (like smoke, chemicals, and fumes). °· Use oxygen therapy and therapy to help improve your lungs (pulmonary rehabilitation) if told by your doctor. If you need home oxygen therapy, ask your doctor if you should buy a tool to measure your oxygen level (oximeter). °· Avoid people who have a sickness you can catch (contagious). °· Avoid going outside when it is very hot, cold, or humid. °· Eat healthy foods. Eat smaller meals more often. Rest before meals. °· Stay active, but remember to also rest. °· Make sure to get all the shots (vaccines) your doctor recommends. Ask your doctor if you need a pneumonia shot. °· Learn and use tips on how to relax. °· Learn and use tips on how to control your breathing as told by your doctor. Try: °¨ Breathing in (inhaling) through your nose for 1 second. Then, pucker your lips and breath out (exhale) through your lips for 2 seconds. °¨ Putting one hand on your belly (abdomen). Breathe in slowly through your nose for 1 second. Your hand on your belly should move out. Pucker your lips and breathe out slowly through your lips. Your hand on your belly  should move in as you breathe out. °· Learn and use controlled coughing to clear thick spit from your lungs. The steps are: °1. Lean your head a little forward. °2. Breathe in deeply. °3. Try to hold your breath for 3 seconds. °4. Keep your mouth slightly open while coughing 2 times. °5. Spit any thick spit out into a tissue. °6. Rest and do the steps again 1 or 2 times as needed. °GET HELP IF: °· You cough up more thick spit than usual. °· There is a change in the color or thickness of the spit. °· It is harder to breathe than usual. °· Your breathing is faster than usual. °GET HELP RIGHT AWAY IF: °· You have shortness of breath while resting. °· You have shortness of breath that stops you from: °¨ Being able to talk. °¨ Doing normal activities. °· You chest hurts for longer than 5 minutes. °· Your skin color is more blue than usual. °· Your pulse oximeter shows that you have low oxygen for longer than 5 minutes. °MAKE SURE YOU: °· Understand these instructions. °· Will watch your condition. °· Will get help right away if you are not doing well or get worse. °  °This information is not intended to replace advice given to you by your health care provider. Make sure you discuss any questions you have with your health care provider. °  °Document Released: 02/24/2008 Document Revised: 09/28/2014 Document Reviewed: 05/04/2013 °[employer] Interactive Patient   Education ©2016 [employer] ° °

## 2015-11-14 NOTE — ED Provider Notes (Addendum)
[facility] Emergency Department Provider Note  ____________________________________________   I have reviewed the triage vital signs and the nursing notes.   HISTORY  Chief Complaint Shortness of Breath; Chest Pain; and COPD    HPI Sierra Galloway is a 55 y.o. female with a history of COPD on 2 L home oxygen, as well as chronic lower extremity edema and CHF on Lasix. Patient states that she has had gradually worsening shortness of breath over the last several months. She has gained weight since December. She saw her primary care doctor yesterday who advised increasing her Lasix, however she did not do so. She states she became more short of breath today. She denies any fever or chills. She is on baseline 2 L of oxygen and has not had to increase it unless she is exerting herself but this is been true for some time. She has chronic orthopnea. She denies any significant leg swelling. However her legs are somewhat more swollen than normal. She does have a history of COPD and continues to smoke. He is occasionally productive cough she states. She denies any chest pain to me or chest tightness. No personal or family history of PE or DVT.  Past Medical History  Diagnosis Date  . Asthma     There are no active problems to display for this patient.   History reviewed. No pertinent past surgical history.  Current Outpatient Rx  Name  Route  Sig  Dispense  Refill  . predniSONE (DELTASONE) 10 MG tablet   Oral   Take 6 tablets (60 mg total) by mouth daily.   5 tablet   0     Allergies Review of patient's allergies indicates no known allergies.  No family history on file.  Social History Social History  Substance Use Topics  . Smoking status: Current Every Day Smoker  . Smokeless tobacco: None  . Alcohol Use: No    Review of Systems Constitutional: No fever/chills Eyes: No visual changes. ENT: No sore throat. No stiff neck no neck pain Cardiovascular:  Denies chest pain. Respiratory:Positives of breath. Gastrointestinal:   no vomiting.  No diarrhea.  No constipation. Genitourinary: Negative for dysuria. Musculoskeletal:Positive chronicr extremity swelling Skin: Negative for rash. Neurological: Negative for headaches, focal weakness or numbness. 10-point ROS otherwise negative.  ____________________________________________   PHYSICAL EXAM:  VITAL SIGNS: ED Triage Vitals  Enc Vitals Group     BP 11/14/15 1942 137/81 mmHg     Pulse Rate 11/14/15 1942 98     Resp 11/14/15 1942 90     Temp 11/14/15 1942 98.1 F (36.7 C)     Temp Source 11/14/15 1942 Oral     SpO2 11/14/15 1942 98 %     Weight 11/14/15 1942 229 lb (103.874 kg)     Height 11/14/15 1942  (1.575 m)     Head Cir --      Peak Flow --      Pain Score 11/14/15 1944 0     Pain Loc --      Pain Edu? --      Excl. in GC? --     Constitutional: Alert and oriented. Well appearing and in no acute distress. Eyes: Conjunctivae are normal. PERRL. EOMI. Head: Atraumatic. Nose: No congestion/rhinnorhea. Mouth/Throat: Mucous membranes are moist.  Oropharynx non-erythematous. Neck: No stridor.   Nontender with no meningismus Cardiovascular: Normal rate, regular rhythm. Grossly normal heart sounds.  Good peripheral circulation. Respiratory: Normal respiratory effort.  No retractions.A  somewhat in the bases but otherwise unremarkable  Abdominal: Soft and nontender. No distention. No guarding no rebound Back:  There is no focal tenderness or step off there is no midline tenderness there are no lesions noted. there is no CVA tenderness Musculoskeletal: No lower extremity tenderness. No joint effusions, no DVT signs strong distal pulsesmild bilateral symmetric pittinga Neurologic:  Normal speech and language. No gross focal neurologic deficits are appreciated.  Skin:  Skin is warm, dry and intact. No rash noted. Psychiatric: Mood and affect are normal. Speech and behavior are  normal.  ____________________________________________   LABS (all labs ordered are listed, but only abnormal results are displayed)  Labs Reviewed  BASIC METABOLIC PANEL - Abnormal; Notable for the following:    Chloride 98 (*)    CO2 34 (*)    Glucose, Bld 114 (*)    All other components within normal limits  CBC - Abnormal; Notable for the following:    WBC 11.9 (*)    All other components within normal limits  TROPONIN I  BRAIN NATRIURETIC PEPTIDE   ____________________________________________  EKG  I personally interpreted any EKGs ordered by me or triage 83 bpm, normal sinus rhythm no acute ST elevation or acute ST depression normal axis  ____________________________________________  RADIOLOGY  I reviewed any imaging ordered by me or triage that were performed during my shift ____________________________________________   PROCEDURES  Procedure(s) performed: None  Critical Care performed: None  ____________________________________________   INITIAL IMPRESSION / ASSESSMENT AND PLAN / ED COURSE  Pertinent labs & imaging results that were available during my care of the patient were reviewed by me and considered in my medical decision making (see chart for details).  Patient with multiple different reasons to be short of breath who saw her doctor yesterday complaining of shortness of breath and was advised to increase her Lasix. Patient did not do so. Ultimately she is orthopneic and short of breath. However, she is 98% on 2 L at this time and she is not evidencing any evidence of respiratory distress. We will check a chest x-ray, BNP, troponin and reassess.   ----------------------------------------- 9:23 PM on 11/14/2015 -----------------------------------------  Patient 100% on 2 L of oxygen no increased respiratory effort, lungs are clear, workup is normal thus far in the emergency department. We discussed admission the hospital patient is adamant that she  wishes to go home. She is she states "scared to death" that she'll catch the flu or a cold. She does not have any URI symptoms. She has no evidence of respiratory distress and at this time she states she is near her baseline. She states that she got anxious about her breathing last night but actually today has been feeling quite well. Her family wanted her to be "checked out". I will encourage her to take the Lasix at home. I did offer to give her Lasix here even though there is no evidence of fluid around the lungs however, she declined stating that she does not wish to urinate on the way home. Given that she declines intervention and declines admission we'll discharge her after a DuoNeb with close outpatient follow-up. Return precautions and follow-up given and understood.   ____________________________________________   FINAL CLINICAL IMPRESSION(S) / ED DIAGNOSES  Final diagnoses:  None      This chart was dictated using voice recognition software.  Despite best efforts to proofread,  errors can occur which can change meaning.     Jeanmarie Plant, MD 11/14/15 2041  Fayrene Fearing  Lynnea Maizes, MD 11/14/15 2124

## 2016-01-07 ENCOUNTER — Ambulatory Visit: Payer: BLUE CROSS/BLUE SHIELD

## 2016-07-21 IMAGING — CR DG CHEST 2V
1 series · 2 of 2 positions shown · non-contrast
Comparison: Radiographs and CT January 2015

CLINICAL DATA: Shortness of breath and chest tightness, onset this
morning.

EXAM:
CHEST  2 VIEW

[Series 1: dg chest 2 view · 0.14mm/px · 2 of 2 slices shown]
[im 1/2]
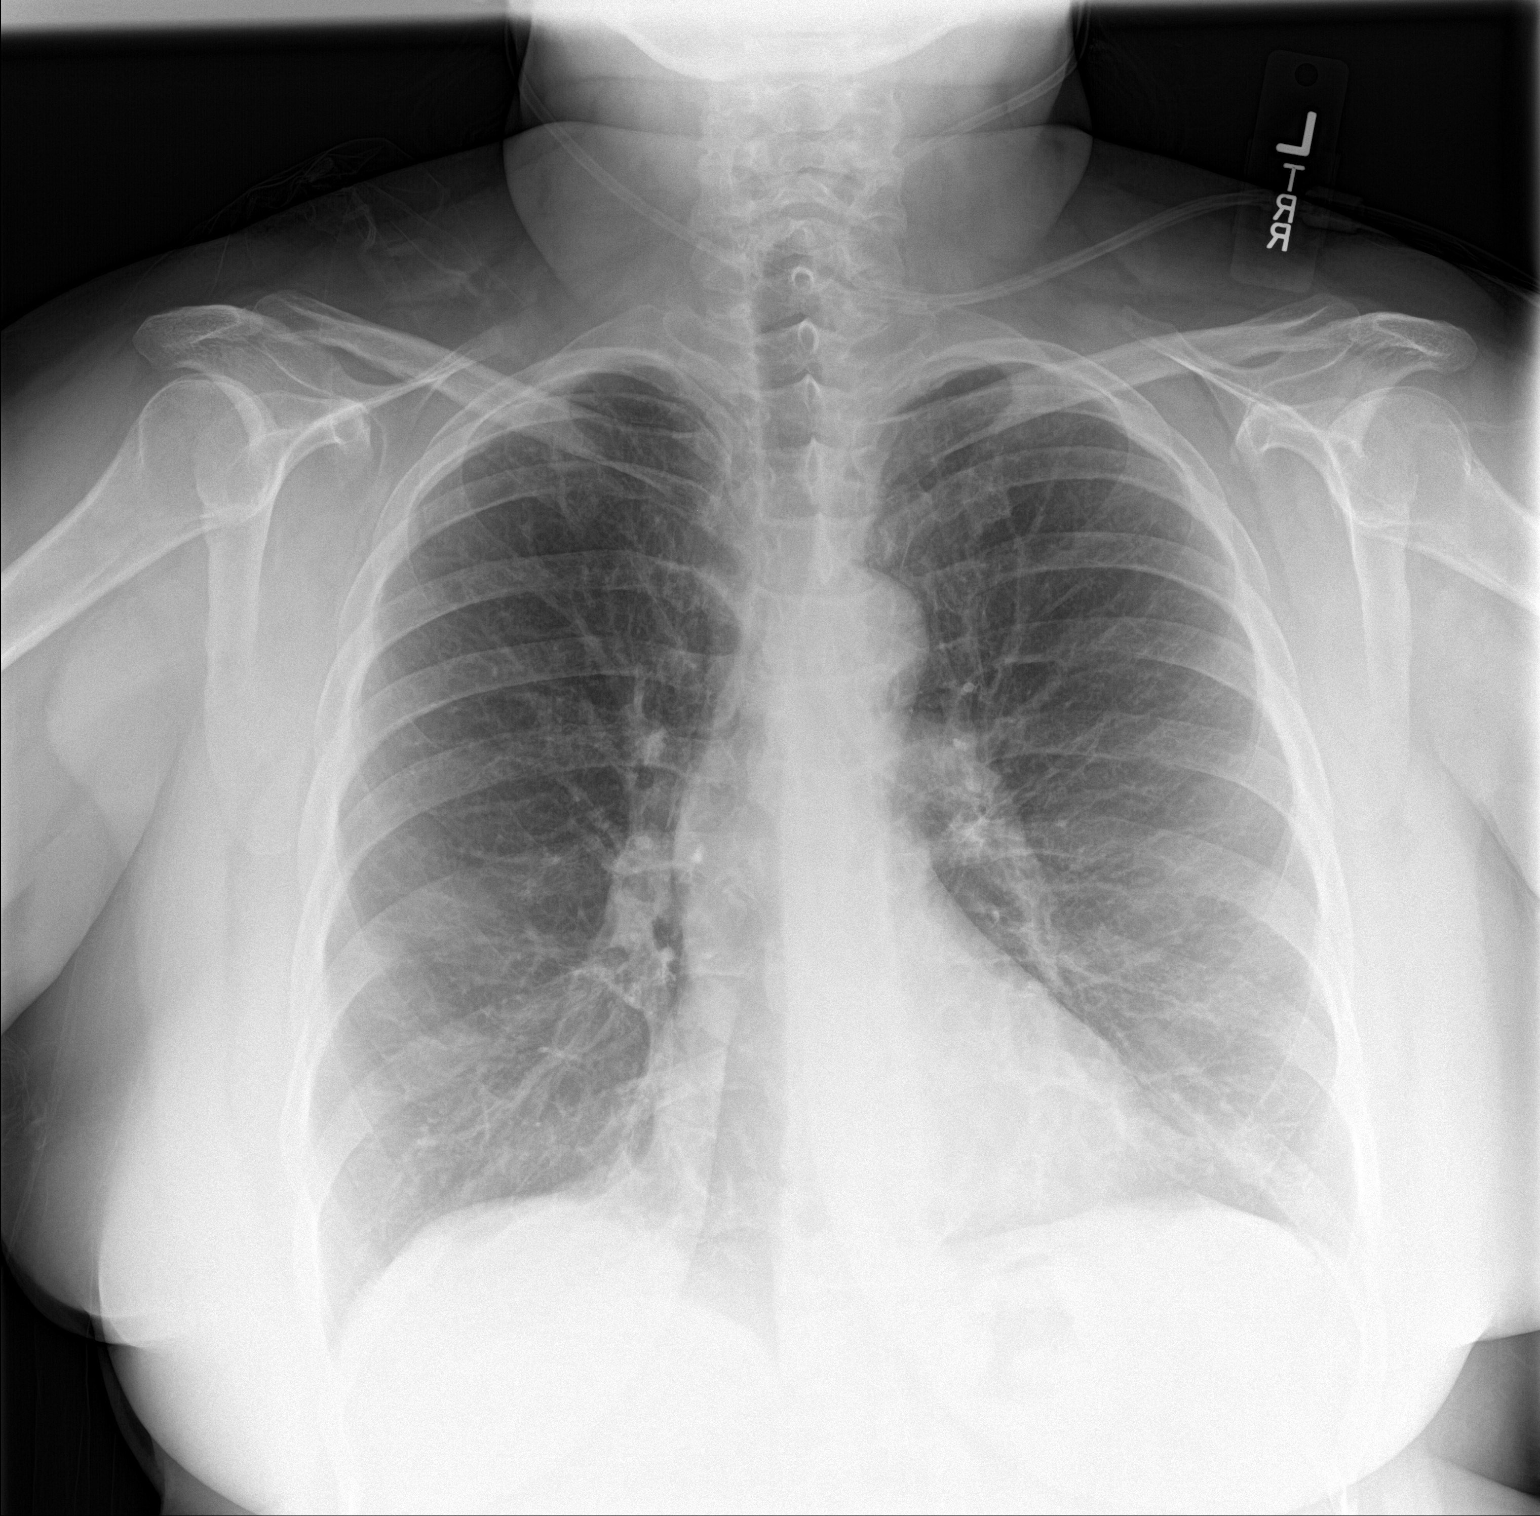
[im 2/2]
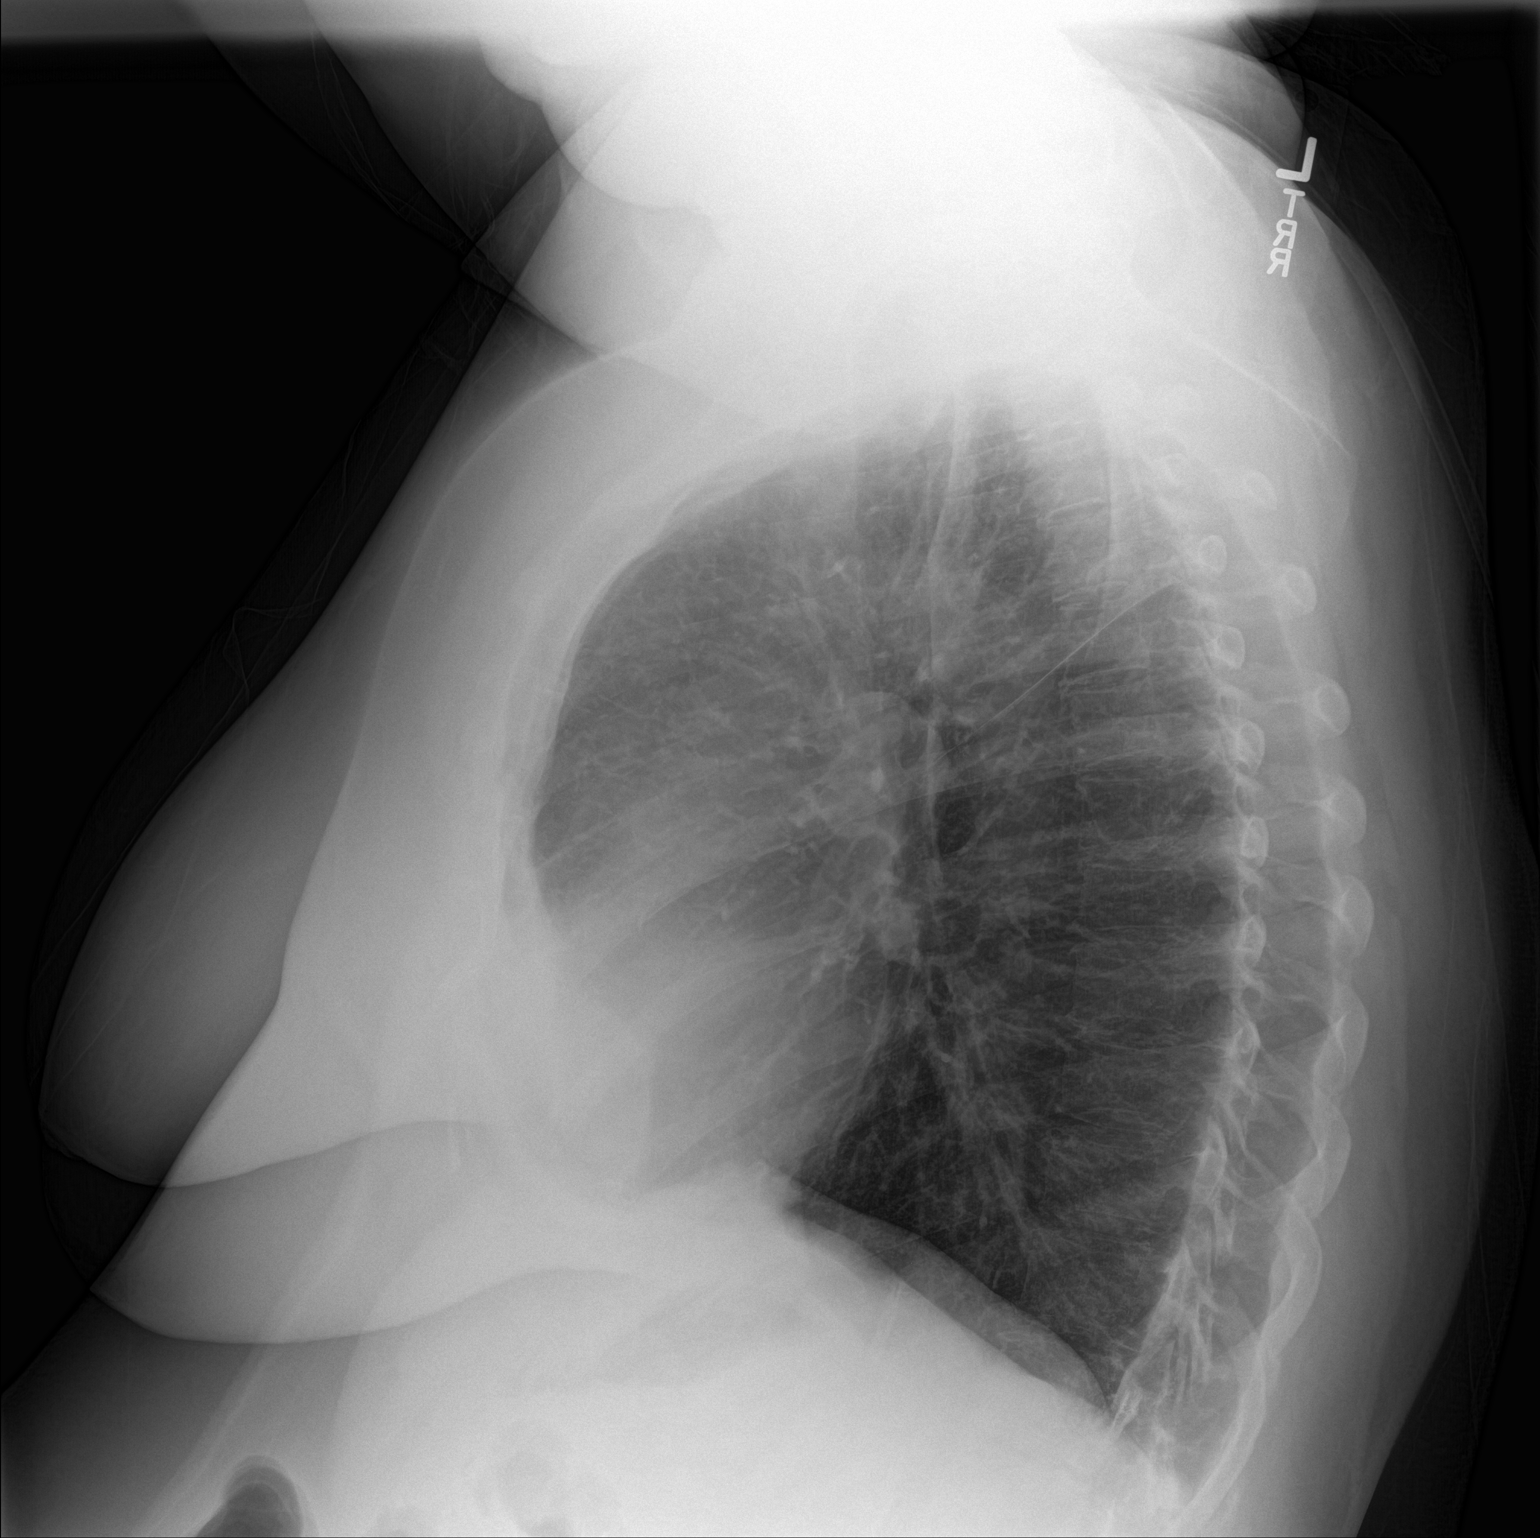

[2 of 2 positions shown; findings below may reference images not displayed]

FINDINGS: Hyperinflation and emphysema, unchanged from prior exam.
Cardiomediastinal contours are normal. No consolidation, pleural
effusion or pneumothorax. No acute osseous abnormalities are seen.
IMPRESSION: Emphysema and chronic hyperinflation. No superimposed acute process.

## 2016-09-21 DEATH — deceased
